# Patient Record
Sex: Female | Born: 1960 | Race: Black or African American | Hispanic: No | Marital: Single
Health system: Southern US, Community
[De-identification: ages and names within clinical notes are randomized; demographics above are authoritative.]

---

## 2009-12-26 ENCOUNTER — Emergency Department (HOSPITAL_COMMUNITY): Admission: EM | Admit: 2009-12-26 | Discharge: 2009-12-26 | Payer: Self-pay | Admitting: Emergency Medicine

## 2010-12-28 IMAGING — CR DG FOOT COMPLETE 3+V*R*
3 series · 3 of 3 positions shown · non-contrast
Comparison: None.

CLINICAL DATA: Foot pain

RIGHT FOOT COMPLETE - 3+ VIEW

[view not recorded (1 of 3)]
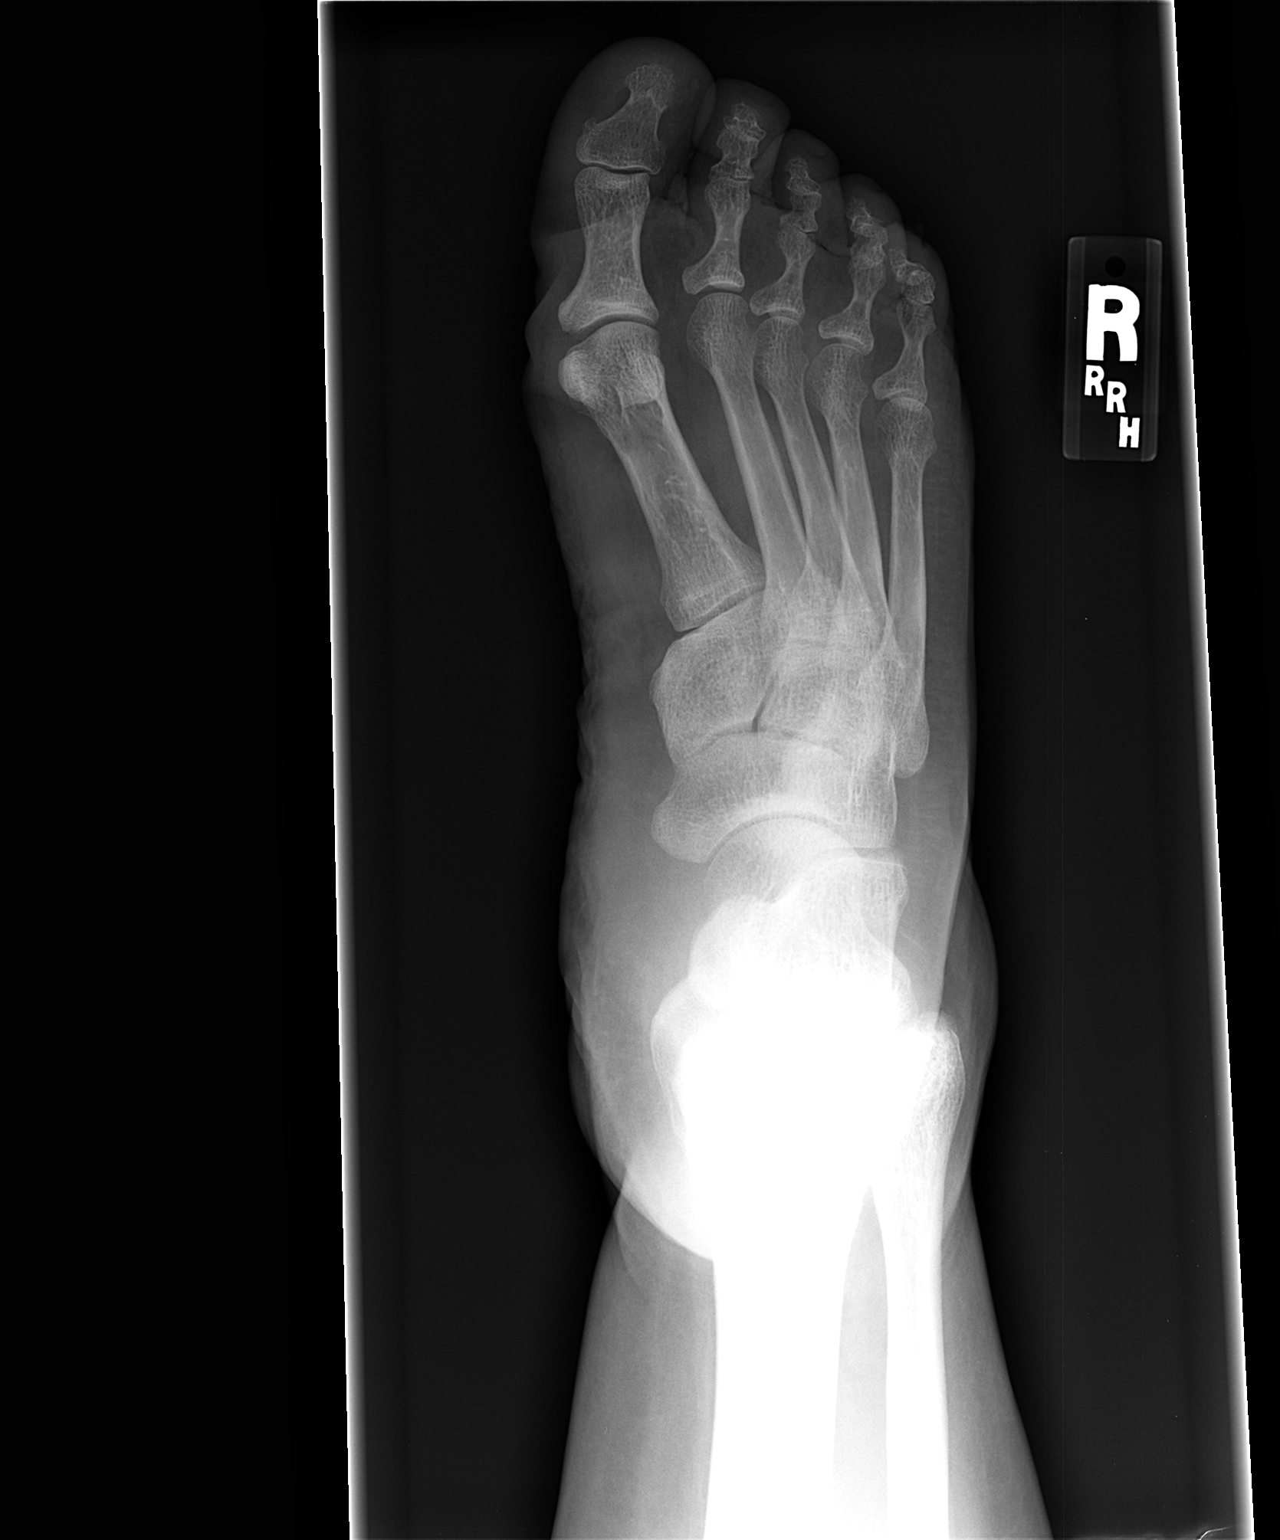

[view not recorded (2 of 3)]
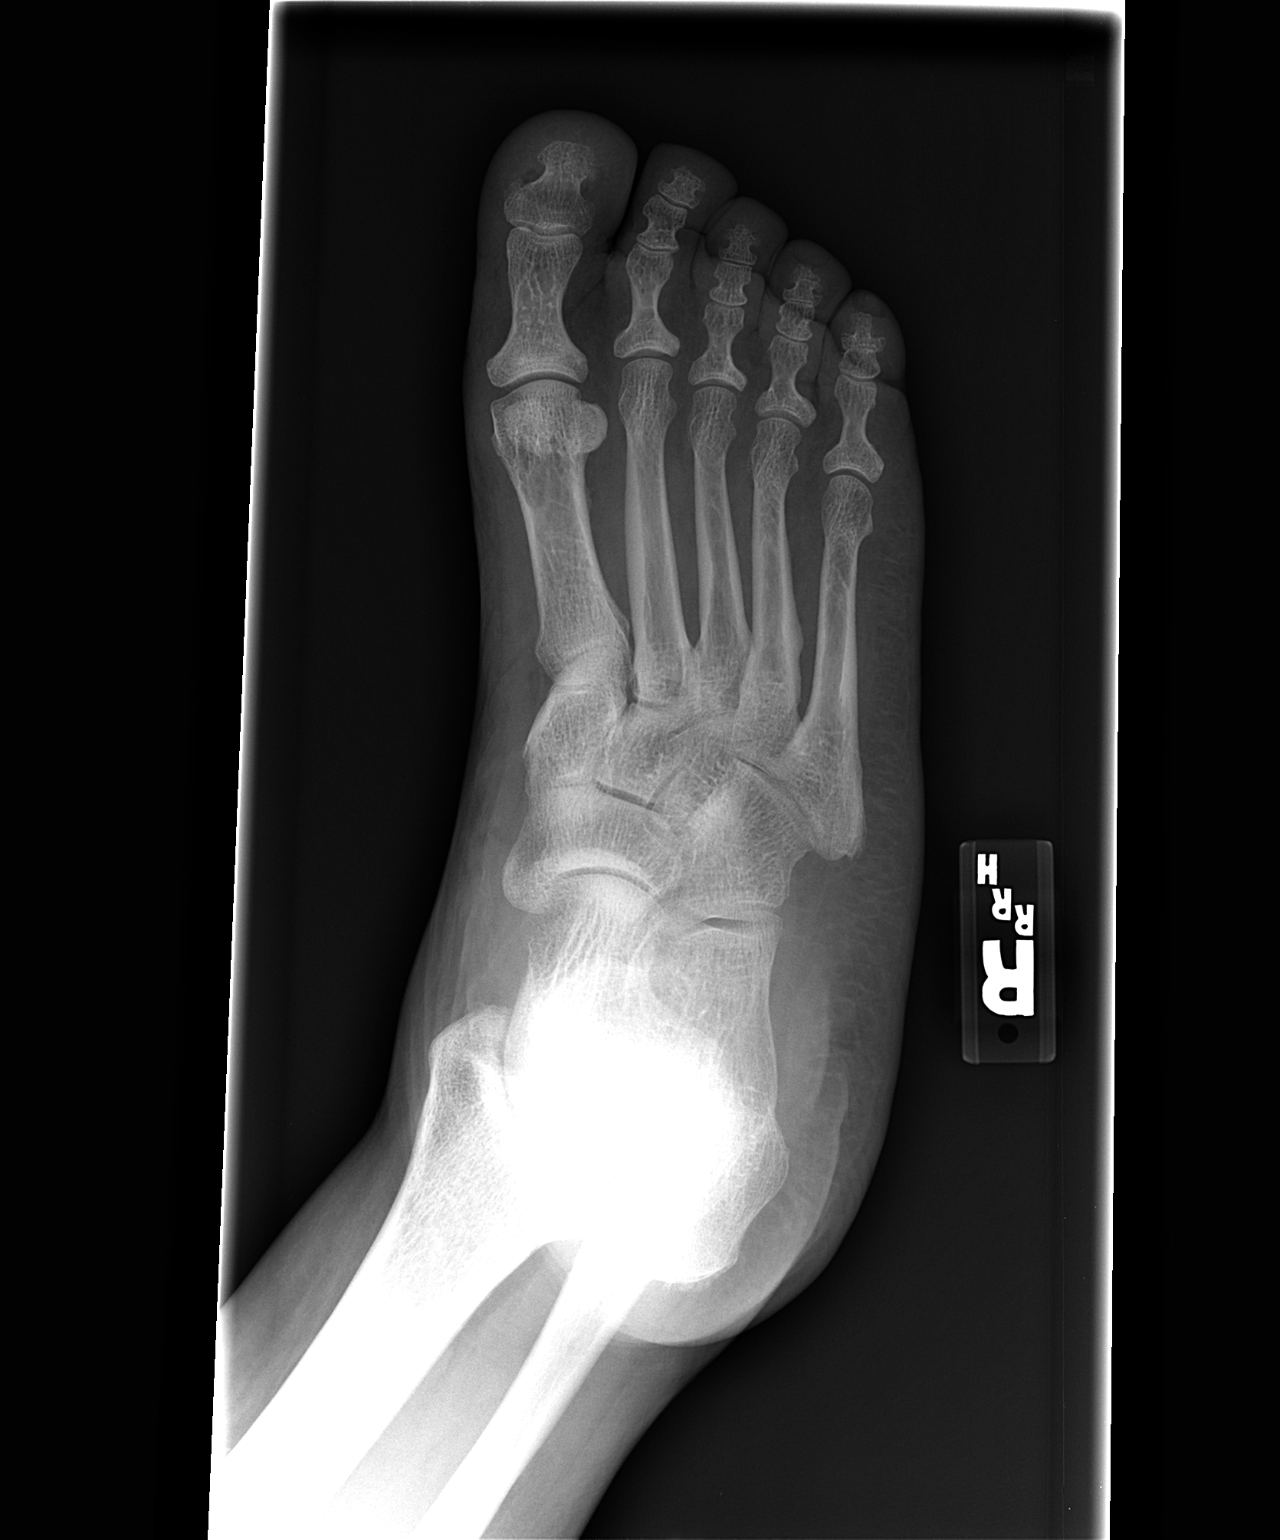

[view not recorded (3 of 3)]
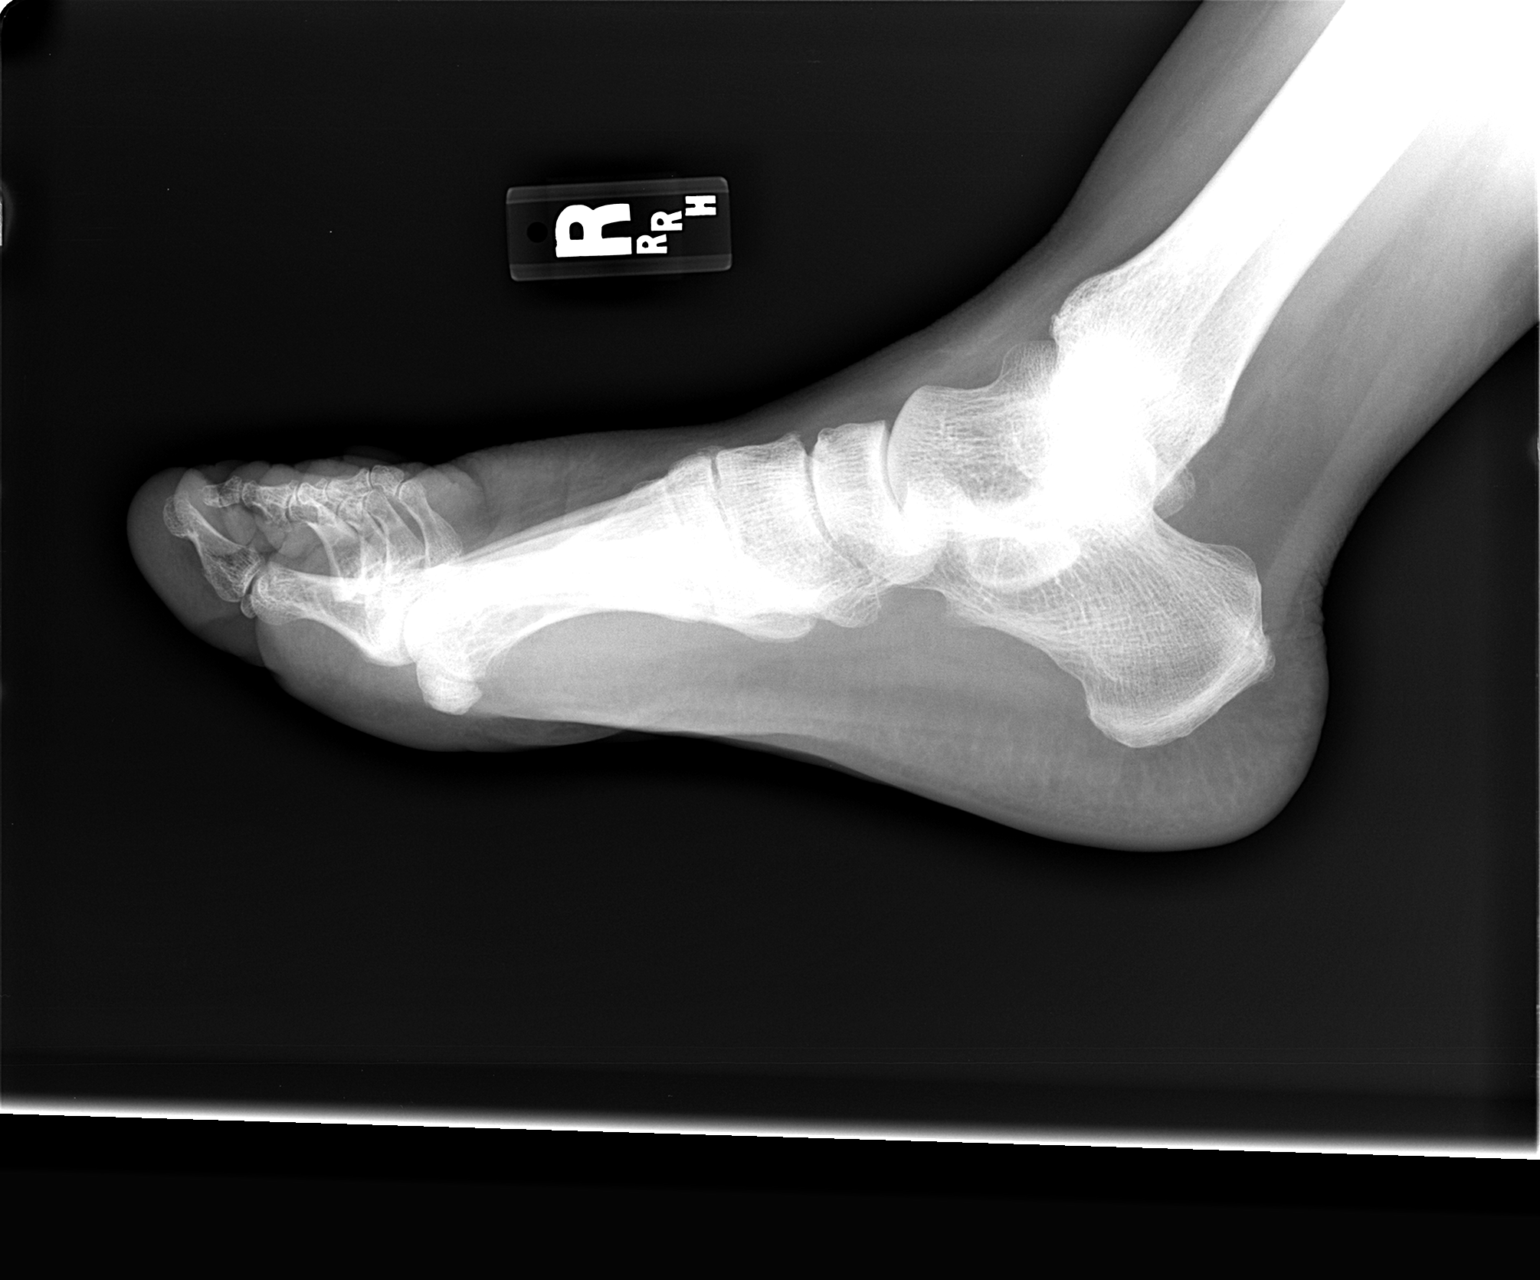

[3 of 3 positions shown; findings below may reference images not displayed]

FINDINGS: There is no evidence of bone, joint, or soft tissue
abnormality.
IMPRESSION: Negative right foot.

## 2017-11-20 LAB — GLUCOSE, POCT (MANUAL RESULT ENTRY): POC Glucose: 102 mg/dl — AB (ref 70–99)

## 2019-06-04 ENCOUNTER — Ambulatory Visit: Payer: Medicare Other | Attending: Internal Medicine

## 2019-06-04 ENCOUNTER — Other Ambulatory Visit: Payer: Self-pay

## 2019-06-04 DIAGNOSIS — Z20822 Contact with and (suspected) exposure to covid-19: Secondary | ICD-10-CM

## 2019-06-06 LAB — NOVEL CORONAVIRUS, NAA: SARS-CoV-2, NAA: NOT DETECTED

## 2019-06-07 ENCOUNTER — Telehealth: Payer: Self-pay

## 2019-06-07 NOTE — Telephone Encounter (Signed)
Given COVID 19 results, verbalizes understanding. 

## 2020-08-27 DEATH — deceased
